# Patient Record
Sex: Female | Born: 1995 | Race: Black or African American | Hispanic: No | Marital: Single | Smoking: Never smoker
Health system: Southern US, Community
[De-identification: ages and names within clinical notes are randomized; demographics above are authoritative.]

## PROBLEM LIST (undated history)

## (undated) DIAGNOSIS — M419 Scoliosis, unspecified: Secondary | ICD-10-CM

## (undated) HISTORY — DX: Scoliosis, unspecified: M41.9

---
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
## 2017-07-31 ENCOUNTER — Other Ambulatory Visit: Payer: Self-pay | Admitting: Family Medicine

## 2017-07-31 DIAGNOSIS — R634 Abnormal weight loss: Secondary | ICD-10-CM

## 2017-07-31 DIAGNOSIS — R0789 Other chest pain: Secondary | ICD-10-CM

## 2017-08-07 ENCOUNTER — Ambulatory Visit
Admission: RE | Admit: 2017-08-07 | Discharge: 2017-08-07 | Disposition: A | Discharge status: Home / Self Care | Payer: Self-pay | Source: Ambulatory Visit | Attending: Family Medicine | Admitting: Family Medicine

## 2017-08-07 ENCOUNTER — Other Ambulatory Visit: Payer: Self-pay | Admitting: Family Medicine

## 2017-08-07 DIAGNOSIS — R634 Abnormal weight loss: Secondary | ICD-10-CM

## 2017-08-07 DIAGNOSIS — R0789 Other chest pain: Secondary | ICD-10-CM

## 2017-08-07 MED ORDER — IOHEXOL 300 MG/ML  SOLN
100.0000 mL | Freq: Once | INTRAMUSCULAR | Status: AC | PRN
  Administered 2017-08-07: 100 mL via INTRAVENOUS

## 2019-09-28 ENCOUNTER — Emergency Department (HOSPITAL_BASED_OUTPATIENT_CLINIC_OR_DEPARTMENT_OTHER)
Admission: EM | Admit: 2019-09-28 | Discharge: 2019-09-28 | Disposition: A | Discharge status: Home / Self Care | Payer: 59 | Attending: Emergency Medicine | Admitting: Emergency Medicine

## 2019-09-28 ENCOUNTER — Other Ambulatory Visit: Payer: Self-pay

## 2019-09-28 ENCOUNTER — Encounter (HOSPITAL_BASED_OUTPATIENT_CLINIC_OR_DEPARTMENT_OTHER): Payer: Self-pay

## 2019-09-28 DIAGNOSIS — Y999 Unspecified external cause status: Secondary | ICD-10-CM | POA: Insufficient documentation

## 2019-09-28 DIAGNOSIS — T300 Burn of unspecified body region, unspecified degree: Secondary | ICD-10-CM

## 2019-09-28 DIAGNOSIS — Y92410 Unspecified street and highway as the place of occurrence of the external cause: Secondary | ICD-10-CM | POA: Insufficient documentation

## 2019-09-28 DIAGNOSIS — T22012A Burn of unspecified degree of left forearm, initial encounter: Secondary | ICD-10-CM | POA: Insufficient documentation

## 2019-09-28 DIAGNOSIS — Y939 Activity, unspecified: Secondary | ICD-10-CM | POA: Diagnosis not present

## 2019-09-28 DIAGNOSIS — W2210XA Striking against or struck by unspecified automobile airbag, initial encounter: Secondary | ICD-10-CM | POA: Diagnosis not present

## 2019-09-28 MED ORDER — METHOCARBAMOL 500 MG PO TABS: 500.0000 mg | ORAL_TABLET | Freq: Two times a day (BID) | ORAL | 0 refills | Status: DC | PRN

## 2019-09-28 MED ORDER — METHOCARBAMOL 500 MG PO TABS: 500.0000 mg | ORAL_TABLET | Freq: Two times a day (BID) | ORAL | 0 refills | Status: AC | PRN
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
